# Patient Record
Sex: Female | Born: 2000 | Race: White | Hispanic: No | Marital: Single
Health system: Southern US, Community
[De-identification: ages and names within clinical notes are randomized; demographics above are authoritative.]

## PROBLEM LIST (undated history)

## (undated) ENCOUNTER — Ambulatory Visit (HOSPITAL_COMMUNITY): Admission: EM | Payer: Medicaid Other | Source: Home / Self Care

## (undated) DIAGNOSIS — Z8659 Personal history of other mental and behavioral disorders: Secondary | ICD-10-CM

## (undated) DIAGNOSIS — F419 Anxiety disorder, unspecified: Secondary | ICD-10-CM

## (undated) DIAGNOSIS — F909 Attention-deficit hyperactivity disorder, unspecified type: Secondary | ICD-10-CM

## (undated) DIAGNOSIS — F329 Major depressive disorder, single episode, unspecified: Secondary | ICD-10-CM

## (undated) HISTORY — PX: ADENOIDECTOMY: SUR15

## (undated) HISTORY — PX: TONSILLECTOMY: SUR1361

## (undated) HISTORY — PX: APPENDECTOMY: SHX54

---
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
## 2023-03-16 ENCOUNTER — Ambulatory Visit (HOSPITAL_COMMUNITY)
Admission: EM | Admit: 2023-03-16 | Discharge: 2023-03-16 | Disposition: A | Discharge status: Home / Self Care | Payer: Medicaid Other | Attending: Family | Admitting: Family

## 2023-03-16 DIAGNOSIS — F4323 Adjustment disorder with mixed anxiety and depressed mood: Secondary | ICD-10-CM

## 2023-03-16 NOTE — ED Provider Notes (Signed)
Behavioral Health Urgent Care Medical Screening Exam  Patient Name: Rachel Mullins MRN: [id number] Date of Evaluation: 03/16/23 Chief Complaint:   Diagnosis:  Final diagnoses:  Adjustment disorder with mixed anxiety and depressed mood    History of Present illness: Rachel Mullins is a 22 y.o. female. Patient presents voluntarily to [facility] behavioral health for walk-in assessment.  Patient is assessed, face-to-face, by nurse practitioner. She is seated in assessment area, no acute distress. Consulted with provider, Dr.  Dwyane Dee, and chart reviewed on 03/16/2023. She  is alert and oriented, pleasant and cooperative during assessment.   Rachel Mullins intended to be seen in outpatient walk-in, upon arrival made aware walk-in hours are limited.  She would like to be linked with medication management as well as individual counseling.  She has an appointment with previous outpatient therapist at [facility] pediatrics upcoming on Tuesday April 9th. She states "I would really prefer to see somebody else, I stopped seeing her last year."  Recent stressors include increased anxiety causing her to miss work both today and yesterday.  Chronic stressors include the death of her father 1 year ago.  Patient endorses increased anxiety ongoing x 1 month.  She was prescribed Lexapro by primary care provider several days ago.  She did not begin this medication yet, would like to establish with outpatient provider to review medications prior to initiation.  She reports she has been prescribed multiple medications in the past.  Most recently prescribed sertraline last year reports it caused her "cannot have any thoughts, I felt like a zombie."  She stopped this medication approximately 1 year ago.  She was prescribed Abilify however caused her to feel angry.  She reports she was prescribed 2 mood stabilizers unable to recall name of medications, these medications were not effective in treating her depression.  She endorses  history of major depressive disorder, suicidal ideation and anxiety.  2 previous inpatient psychiatric hospitalizations.  At age 57 she reports she "had a mental breakdown."  At age 81 she ingested an intentional overdose in a suicide attempt.  Both previous admissions at [facility] behavioral health.  No family mental health history reported.  Patient  presents with anxious mood, congruent affect. She  denies suicidal and homicidal ideations. Denies history of non suicidal self-harm.  Patient easily  contracts verbally for safety with this Probation officer.    Patient has normal speech and behavior.  She  denies auditory and visual hallucinations.  Patient is able to converse coherently with goal-directed thoughts and no distractibility or preoccupation.  Denies symptoms of paranoia.  Objectively there is no evidence of psychosis/mania or delusional thinking.  Rachel Mullins resides in [place] with her mother and older brother.  She denies access to weapons.  She is employed in the Production designer, theatre/television/film.  Patient endorses average sleep and appetite.  She endorses daily marijuana use.  She denies alcohol use, denies substance use aside from marijuana.  Patient offered support and encouragement.  Prefers to follow-up with outpatient psychiatry on tomorrow versus admission.  Rachel Mullins gives verbal consent to speak with her mother, Rachel Mullins phone number [phone].  Patient's mother verbalizes agreement with plan for follow-up with outpatient psychiatry.   Patient and family are educated and verbalize understanding of mental health resources and other crisis services in the community. They are instructed to call 911 and present to the nearest emergency room should patient experience any suicidal/homicidal ideation, auditory/visual/hallucinations, or detrimental worsening of mental health condition.      [facility] from  03/16/2023 in [place] No Risk       Psychiatric  Specialty Exam  Presentation  General Appearance:Appropriate for Environment; Casual  Eye Contact:Good  Speech:Clear and Coherent; Normal Rate  Speech Volume:Normal  Handedness:Right   Mood and Affect  Mood: Anxious  Affect: Congruent   Thought Process  Thought Processes: Coherent; Goal Directed; Linear  Descriptions of Associations:Intact  Orientation:Full (Time, Place and Person)  Thought Content:Logical    Hallucinations:None  Ideas of Reference:None  Suicidal Thoughts:No  Homicidal Thoughts:No   Sensorium  Memory: Immediate Good; Recent Good  Judgment: Fair  Insight: Fair   Community education officer  Concentration: Good  Attention Span: Good  Recall: Good  Fund of Knowledge: Good  Language: Good   Psychomotor Activity  Psychomotor Activity: Normal   Assets  Assets: Communication Skills; Desire for Improvement; Financial Resources/Insurance; Housing; Intimacy; Leisure Time; Physical Health; Resilience   Sleep  Sleep: Fair  Number of hours: No data recorded  Physical Exam: Physical Exam Vitals and nursing note reviewed.  Constitutional:      Appearance: Normal appearance. She is well-developed and normal weight.  HENT:     Head: Normocephalic and atraumatic.     Nose: Nose normal.  Cardiovascular:     Rate and Rhythm: Normal rate.  Pulmonary:     Effort: Pulmonary effort is normal.  Musculoskeletal:        General: Normal range of motion.     Cervical back: Normal range of motion.  Skin:    General: Skin is warm and dry.  Neurological:     Mental Status: She is alert and oriented to person, place, and time.  Psychiatric:        Attention and Perception: Attention and perception normal.        Mood and Affect: Affect normal. Mood is anxious.        Speech: Speech normal.        Behavior: Behavior normal. Behavior is cooperative.        Thought Content: Thought content normal.        Cognition and Memory: Cognition  and memory normal.    Review of Systems  Constitutional: Negative.   HENT: Negative.    Eyes: Negative.   Respiratory: Negative.    Cardiovascular: Negative.   Gastrointestinal: Negative.   Genitourinary: Negative.   Musculoskeletal: Negative.   Skin: Negative.   Neurological: Negative.   Psychiatric/Behavioral:  The patient is nervous/anxious.    Blood pressure 138/82, pulse 74, temperature 98.3 F (36.8 C), temperature source Oral, resp. rate 18, SpO2 100 %. There is no height or weight on file to calculate BMI.  Musculoskeletal: Strength & Muscle Tone: within normal limits Gait & Station: normal Patient leans: N/A   [place] [facility] Discharge Disposition for Follow up and Recommendations: Based on my evaluation the patient does not appear to have an emergency medical condition and can be discharged with resources and follow up care in outpatient services for Medication Management and Individual Therapy Follow-up with outpatient psychiatry, resources provided.  Continue current medications.   Lucky Rathke, FNP 03/16/2023, 7:14 PM

## 2023-03-16 NOTE — Progress Notes (Signed)
   03/16/23 1753  [place] (Walk-ins at [facility] only)  How Did You Hear About [place]? Family/Friend  What Is the Reason for Your Visit/Call Today? ROUTINE: Rachel Mullins is a 22 year old female presents for evaluation of increased anxiety. She has a history of ADHD, anxiety, despair, suicidal ideation, and self-injurious behaviors. The patient states that since she was 22 years old, she has had persistent symptoms. No current SI. No prior AVH or HI. No alcohol use. However, patient does smoke THC daily and last use was 2 weeks ago. The patient reports that her anxiety and panic attacks have increased within the last week. She identifies her father's passing from last year as a stressor or trigger. She has lost her appetite and been sleeping poorly due to her anxiousness. The patient's anxiousness has caused her to miss work for the past two days. Two to three weeks ago, she saw her PCP for bronchitis. Her PCP informed her during that appointment that her problems were caused by panic episodes rather than bronchitis. A prescription for Lexapro was provided. However, the patient's concern of negative side effects led her deciding against taking the medication. Last week, the PCP suggested that she visit their psychiatrist, and an appointment was set. The patient, however, accidently missed her appointment. She has come in today asking to be prescribed a different drug that will have less side effects in order to control her anxiety. Patient lives with her mother.  How Long Has This Been Causing You Problems? > than 6 months  Have You Recently Had Any Thoughts About Hurting Yourself? No  Are You Planning to Commit Suicide/Harm Yourself At This time? No  Have you Recently Had Thoughts About [place]? No  Are You Planning To Harm Someone At This Time? No  Are you currently experiencing any auditory, visual or other hallucinations? No  Have You Used Any Alcohol or Drugs in the Past 24 Hours? No   Do you have any current medical co-morbidities that require immediate attention? No  Clinician description of patient physical appearance/behavior: Patient appears anxious. Polite.  What Do You Feel Would Help You the Most Today? Treatment for Depression or other mood problem;Medication(s);Stress Management  If access to [facility] was not available, would you have sought care in the Emergency Department? No  Determination of Need Routine (7 days)  Options For Referral Medication Management;Outpatient Therapy;Intensive Outpatient Therapy;Partial Hospitalization

## 2023-03-16 NOTE — Discharge Instructions (Signed)
Patient is instructed prior to discharge to:  Take all medications as prescribed by his/her mental healthcare provider. Report any adverse effects and or reactions from the medicines to his/her outpatient provider promptly. Keep all scheduled appointments, to ensure that you are getting refills on time and to avoid any interruption in your medication.  If you are unable to keep an appointment call to reschedule.  Be sure to follow-up with resources and follow-up appointments provided.  Patient has been instructed & cautioned: To not engage in alcohol and or illegal drug use while on prescription medicines. In the event of worsening symptoms, patient is instructed to call the crisis hotline, 911 and or go to the nearest ED for appropriate evaluation and treatment of symptoms. To follow-up with his/her primary care provider for your other medical issues, concerns and or health care needs.  Information: -National Suicide Prevention Lifeline 1-800-SUICIDE or 1-800-273-8255.  -988 offers 24/7 access to trained crisis counselors who can help people experiencing mental health-related distress. People can call or text 988 or chat 988lifeline.org for themselves or if they are worried about a loved one who may need crisis support.     Outpatient Services for Therapy and Medication Management for Medicaid  [facility] [address] [place], [place], [address] [phone] phone  New Patient Assessment/Therapy Walk-ins Monday and Wednesday: 8am until slots are full. Every 1st and 2nd Friday: 1pm - 5pm  NO ASSESSMENT/THERAPY WALK-INS ON TUESDAYS OR THURSDAYS  New Patient Psychiatry/Medication Management Walk-ins Monday-Friday: 8am-11am  For all walk-ins, we ask that you arrive by 7:30am because patient will be seen in the order of arrival.  Availability is limited; therefore, you may not be seen on the same day that you walk-in.  Our goal is to serve and meet the needs of our  community to the best of our ability.   [facility] [address] [place], [place], [address] [phone] phone  [facility] [address] [place], [place], [address] [phone] phone (Aetna, AmeriHealth [place], BCBS, Cigna, Ever[employer], [employer] Health Plans, [facility], BCBS Healthy Blue, Humana, [employer], Medcost, Medicare, Medicaid, Optum, Tricare, UHC, UHC Community Plan, Wellcare)  [facility] [address] [place], [place], [address] [phone] phone  [facility] [address] [place], [place], [address] [phone] phone  [facility] [address] [place], [place], [address] [phone] phone  [facility] [address] [place], [place], [address] [phone] phone  [facility] [address] [place], [place], [address] [phone] phone  [facility] [address] [place], [place], [address] [phone] phone [phone] fax  [facility] [address] [place], [place], [address] [phone] phone [phone] fax  [facility] [address] [place], [place], [address] [phone] phone  [facility] [address] [place], [place], [address]  [phone] phone  [facility]  (Adults Only) [address] [place], , [address]  [phone] phone [phone] fax  

## 2023-03-17 ENCOUNTER — Ambulatory Visit (INDEPENDENT_AMBULATORY_CARE_PROVIDER_SITE_OTHER): Payer: Medicaid Other | Admitting: Physician Assistant

## 2023-03-17 VITALS — BP 128/89 | HR 70 | Ht 63.0 in | Wt 109.6 lb

## 2023-03-17 DIAGNOSIS — F410 Panic disorder [episodic paroxysmal anxiety] without agoraphobia: Secondary | ICD-10-CM

## 2023-03-17 DIAGNOSIS — F411 Generalized anxiety disorder: Secondary | ICD-10-CM

## 2023-03-17 MED ORDER — BUSPIRONE HCL 7.5 MG PO TABS: 7.5000 mg | ORAL_TABLET | Freq: Two times a day (BID) | ORAL | 1 refills | Status: DC

## 2023-03-17 MED ORDER — HYDROXYZINE HCL 10 MG PO TABS: 10.0000 mg | ORAL_TABLET | Freq: Three times a day (TID) | ORAL | 1 refills | Status: DC | PRN
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
## 2023-03-18 ENCOUNTER — Encounter (HOSPITAL_COMMUNITY): Payer: Self-pay | Admitting: Physician Assistant

## 2023-04-20 ENCOUNTER — Telehealth (HOSPITAL_COMMUNITY): Payer: Self-pay | Admitting: *Deleted

## 2023-04-20 NOTE — Telephone Encounter (Signed)
Patient called asking for refill of Buspar. Has appointment scheduled end of May.

## 2023-04-21 ENCOUNTER — Other Ambulatory Visit (HOSPITAL_COMMUNITY): Payer: Self-pay | Admitting: Physician Assistant

## 2023-04-21 DIAGNOSIS — F410 Panic disorder [episodic paroxysmal anxiety] without agoraphobia: Secondary | ICD-10-CM

## 2023-04-21 MED ORDER — BUSPIRONE HCL 7.5 MG PO TABS: 7.5000 mg | ORAL_TABLET | Freq: Two times a day (BID) | ORAL | 1 refills | Status: DC

## 2023-04-21 NOTE — Telephone Encounter (Signed)
Patient's medication to be e-prescribed to pharmacy of choice. 

## 2023-04-21 NOTE — Progress Notes (Signed)
Provider was contacted by Elder Love, RN regarding request for patient's medication to be refilled. Patient's medication to be e-prescribed to pharmacy of choice.

## 2023-04-27 ENCOUNTER — Ambulatory Visit (INDEPENDENT_AMBULATORY_CARE_PROVIDER_SITE_OTHER): Payer: Medicaid Other | Admitting: Physician Assistant

## 2023-04-27 DIAGNOSIS — F411 Generalized anxiety disorder: Secondary | ICD-10-CM | POA: Diagnosis not present

## 2023-04-27 DIAGNOSIS — F410 Panic disorder [episodic paroxysmal anxiety] without agoraphobia: Secondary | ICD-10-CM | POA: Diagnosis not present

## 2023-04-27 MED ORDER — BUSPIRONE HCL 10 MG PO TABS: 10.0000 mg | ORAL_TABLET | Freq: Two times a day (BID) | ORAL | 1 refills | Status: DC

## 2023-04-27 MED ORDER — HYDROXYZINE HCL 25 MG PO TABS: 25.0000 mg | ORAL_TABLET | Freq: Three times a day (TID) | ORAL | 1 refills | Status: DC | PRN

## 2023-04-27 NOTE — Progress Notes (Signed)
BH MD/PA/NP OP Progress Note  04/28/2023 1:33 AM Rachel Mullins  MRN:  [id number]  Chief Complaint:  Chief Complaint  Patient presents with   Follow-up   Medication Management   HPI:   Rachel Mullins is a 22 year old, Caucasian female with a past psychiatric history significant for depression, generalized anxiety disorder with panic attacks, attention deficit hyperactivity disorder (unspecified type), and adjustment disorder who presents to [facility] for follow-up and medication management.  Patient is currently being managed on the following psychiatric medications:  Hydroxyzine 10 mg 3 times daily as needed Buspirone 7.5 mg 2 times daily  Patient reports that both medications have been helpful in the management of her anxiety.  She reports that the use of her hydroxyzine has helped to prevent panic attacks.  Patient does report that her buspirone could be stronger.  Patient continues to endorse ongoing anxiety and rates her anxiety an 8 out of 10.  Patient's current stressors include the need to get her own place and her brother being at home.  Patient also reports that her first cousin recently died.  Patient endorses depression and rates her depression as 6 out of 10 with 10 being most severe.  Patient endorses depression every day with symptoms lasting the whole day depending on what she is thinking about.  Patient endorses the following depressive symptoms: Dissociation, numbness, and decreased appetite.  A PHQ-9 screen was performed with the patient scoring a 20.  A GAD-7 screen was also performed with the patient scored a 20.  Patient is alert and oriented x 4, calm, cooperative, and fully engaged in conversation during the encounter.  Patient describes her mood as tired and not in the best mood due to her mother.  Patient denies suicidal or homicidal ideations.  She further denies auditory or visual hallucinations and does not appear to be responding to  internal/external stimuli.  Patient endorses fair sleep and receives on average 5 to 7 hours of sleep each night.  Patient endorses fair appetite and eats on average 1-2 meals per day.  Patient denies alcohol consumption.  Patient denies tobacco use but does engage in vaping.  Patient endorses illicit drug use in the form of weed but states that she has cut back some.  Visit Diagnosis:    ICD-10-CM   1. Generalized anxiety disorder with panic attacks  F41.1 hydrOXYzine (ATARAX) 25 MG tablet   F41.0 busPIRone (BUSPAR) 10 MG tablet      Past Psychiatric History:  Patient has been diagnosed with the following psychiatric diagnoses: Depression, anxiety, ADHD, adjustment disorder, and possible bipolar disorder   Patient reports that she has been hospitalized for mental health twice.  She states that she was first hospitalized when she was 22 years old due to attempted suicide.  Patient was also hospitalized when she was 22 years of age but states that she does not remember why.  Although patient remembers during her second hospitalization was that she was in the back of a police car and she was taking a lot of medications at the time.   Patient endorses a past history of suicide attempt when she was 22 years of age.  Patient states that during her suicide attempt, she ingested 3 bottles of pills that were prescribed to her.   Patient denies a past history of homicide attempt  Past Medical History:  Past Medical History:  Diagnosis Date   ADHD (attention deficit hyperactivity disorder)    Anxiety  Depression 07/2015   History of eating disorder 10/09/2015    Past Surgical History:  Procedure Laterality Date   ADENOIDECTOMY     APPENDECTOMY     TONSILLECTOMY      Family Psychiatric History:  Father: depression Mother: anxiety Brother: possible bipolar disorder but has not been officially diagnosed Aunt (paternal) - depression   Family history of suicide attempts: Patient  denies Family history of homicide attempts: Patient denies Family history of substance abuse: Patient reports that her brother abuses cocaine and meth.  She reports that her twin brother abuses cocaine  Family History:  Family History  Problem Relation Age of Onset   Diabetes Father    Mental illness Brother     Social History:  Social History   Socioeconomic History   Marital status: Single    Spouse name: Not on file   Number of children: Not on file   Years of education: Not on file   Highest education level: Not on file  Occupational History   Not on file  Tobacco Use   Smoking status: Passive Smoke Exposure - Never Smoker   Smokeless tobacco: Never  Substance and Sexual Activity   Alcohol use: No   Drug use: Not on file   Sexual activity: Never  Other Topics Concern   Not on file  Social History Narrative   Not on file   Social Determinants of Health   Financial Resource Strain: Not on file  Food Insecurity: Not on file  Transportation Needs: Not on file  Physical Activity: Not on file  Stress: Not on file  Social Connections: Not on file    Allergies:  Allergies  Allergen Reactions   Sulfamethoxazole Palpitations    Metabolic Disorder Labs: No results found for: "HGBA1C", "MPG" No results found for: "PROLACTIN" No results found for: "CHOL", "TRIG", "HDL", "CHOLHDL", "VLDL", "LDLCALC" Lab Results  Component Value Date   TSH 0.496 01/21/2018    Therapeutic Level Labs: No results found for: "LITHIUM" No results found for: "VALPROATE" No results found for: "CBMZ"  Current Medications: Current Outpatient Medications  Medication Sig Dispense Refill   albuterol (PROAIR HFA) 108 (90 Base) MCG/ACT inhaler Inhale 2 puffs into the lungs every 6 (six) hours as needed for wheezing or shortness of breath.     ARIPiprazole (ABILIFY) 10 MG tablet Take 1 tablet (10 mg total) by mouth at bedtime. 30 tablet 0   buPROPion (WELLBUTRIN XL) 150 MG 24 hr tablet Take  1 tablet (150 mg total) by mouth daily. 30 tablet 0   busPIRone (BUSPAR) 10 MG tablet Take 1 tablet (10 mg total) by mouth 2 (two) times daily. 60 tablet 1   hydrOXYzine (ATARAX) 25 MG tablet Take 1 tablet (25 mg total) by mouth 3 (three) times daily as needed. 75 tablet 1   ibuprofen (ADVIL,MOTRIN) 200 MG tablet Take 400 mg by mouth every 6 (six) hours as needed (for pain or headaches).     No current facility-administered medications for this visit.     Musculoskeletal: Strength & Muscle Tone: within normal limits Gait & Station: normal Patient leans: N/A  Psychiatric Specialty Exam: Review of Systems  Psychiatric/Behavioral:  Negative for decreased concentration, dysphoric mood, hallucinations, self-injury and suicidal ideas. The patient is nervous/anxious. The patient is not hyperactive.     There were no vitals taken for this visit.There is no height or weight on file to calculate BMI.  General Appearance: Casual  Eye Contact:  Fair  Speech:  Clear and  Coherent and Normal Rate  Volume:  Normal  Mood:  Anxious and Depressed  Affect:  Appropriate  Thought Process:  Coherent, Goal Directed, and Descriptions of Associations: Intact  Orientation:  Full (Time, Place, and Person)  Thought Content: WDL   Suicidal Thoughts:  No  Homicidal Thoughts:  No  Memory:  Immediate;   Good Recent;   Good Remote;   Good  Judgement:  Good  Insight:  Fair  Psychomotor Activity:  Normal  Concentration:  Concentration: Good and Attention Span: Good  Recall:  Good  Fund of Knowledge: Good  Language: Good  Akathisia:  No  Handed:  Right  AIMS (if indicated): not done  Assets:  Communication Skills Desire for Improvement Housing Social Support Vocational/Educational  ADL's:  Intact  Cognition: WNL  Sleep:  Fair   Screenings: AIMS    Flowsheet Row [facility] Ad[facility] (Discharged[facility]  AIMS Total Score 0 0      GAD-7    Flowsheet Row Clinical Support from 04/27/2023 in [facility] Office Visit from 03/17/2023 in [facility]  Total GAD-7 Score 20 19      PHQ2-9    Flowsheet Row Clinical Support from 04/27/2023 in [facility] Office Visit from 03/17/2023 in [facility]  PHQ-2 Total Score 4 4  PHQ-9 Total Score 20 19      Flowsheet Row Clinical Support from 04/27/2023 in [facility] Office Visit from 03/17/2023 in [facility] ED from 03/16/2023 in [facility]  C-SSRS RISK CATEGORY Low Risk Low Risk No Risk        Assessment and Plan:   Kyiah Saca is a 22 year old, Caucasian female with a past psychiatric history significant for depression, generalized anxiety disorder with panic attacks, attention deficit hyperactivity disorder (unspecified type), and adjustment disorder who presents to [facility] for follow-up and medication management.  Patient reports that her medications have been helpful in managing her anxiety and limiting her panic attacks; however, patient believes that her buspirone could be more effective.  Patient continues to endorse ongoing anxiety as well as some depressive symptoms.  Provider recommended increasing patient's hydroxyzine from 10 mg to 25 mg 3 times daily as needed for the management of her anxiety.  Lastly, provider recommended increasing patient's buspirone from 7.5 mg to 10 mg 2 times daily for the management of her anxiety/panic attacks.  Patient was agreeable to recommendation.  Patient's medications to be prescribed to pharmacy of choice.  Provider allowed for time at the end of the encounter to discuss potential adverse side effects to patient's current  medication regimen.  Patient vocalized understanding.  Provider encouraged patient to abstain from marijuana use.  Collaboration of Care: Collaboration of Care: Medication Management AEB provider managing patient's psychiatric medications, Psychiatrist AEB patient being followed by mental health provider at this facility, and Referral or follow-up with counselor/therapist AEB patient being seen by a licensed clinical social worker at this facility  Patient/Guardian was advised Release of Information must be obtained prior to any record release in order to collaborate their care with an outside provider. Patient/Guardian was advised if they have not already done so to contact the registration department to sign all necessary forms in order for [place] to release information regarding their care.  Consent: Patient/Guardian gives verbal consent for treatment and assignment of benefits for services provided during this visit. Patient/Guardian expressed understanding and agreed to proceed.   1. Generalized anxiety disorder with panic attacks  - hydrOXYzine (ATARAX) 25 MG tablet; Take 1 tablet (25 mg total) by mouth 3 (three) times daily as needed.  Dispense: 75 tablet; Refill: 1 - busPIRone (BUSPAR) 10 MG tablet; Take 1 tablet (10 mg total) by mouth 2 (two) times daily.  Dispense: 60 tablet; Refill: 1  Patient to follow-up in 6 weeks Provider spent a total of 16 minutes with the patient/reviewing patient's chart  Meta Hatchet, PA 04/28/2023, 1:33 AM

## 2023-04-28 ENCOUNTER — Encounter (HOSPITAL_COMMUNITY): Payer: Self-pay | Admitting: Physician Assistant

## 2023-05-01 ENCOUNTER — Ambulatory Visit (INDEPENDENT_AMBULATORY_CARE_PROVIDER_SITE_OTHER): Payer: Medicaid Other | Admitting: Mental Health

## 2023-05-01 DIAGNOSIS — F411 Generalized anxiety disorder: Secondary | ICD-10-CM

## 2023-05-01 DIAGNOSIS — F410 Panic disorder [episodic paroxysmal anxiety] without agoraphobia: Secondary | ICD-10-CM

## 2023-05-01 DIAGNOSIS — F331 Major depressive disorder, recurrent, moderate: Secondary | ICD-10-CM

## 2023-05-02 ENCOUNTER — Ambulatory Visit (HOSPITAL_COMMUNITY): Payer: PRIVATE HEALTH INSURANCE | Admitting: Mental Health

## 2023-05-02 DIAGNOSIS — F410 Panic disorder [episodic paroxysmal anxiety] without agoraphobia: Secondary | ICD-10-CM | POA: Insufficient documentation
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
## 2023-05-23 ENCOUNTER — Telehealth (HOSPITAL_COMMUNITY): Payer: Self-pay | Admitting: *Deleted

## 2023-05-23 NOTE — Telephone Encounter (Signed)
Patient called stating that she would like to speak with her provider. Was noticing on the higher dose of Buspar that her heart would race and she felt more panic and anxiety. Would like to go back to the lower dosage.

## 2023-05-30 ENCOUNTER — Other Ambulatory Visit (HOSPITAL_COMMUNITY): Payer: Self-pay | Admitting: Physician Assistant

## 2023-05-30 DIAGNOSIS — F410 Panic disorder [episodic paroxysmal anxiety] without agoraphobia: Secondary | ICD-10-CM

## 2023-05-30 MED ORDER — BUSPIRONE HCL 7.5 MG PO TABS: 7.5000 mg | ORAL_TABLET | Freq: Two times a day (BID) | ORAL | 1 refills | Status: AC

## 2023-05-30 NOTE — Progress Notes (Signed)
Provider was contacted by Elder Love, RN regarding patient's concern over her current dosage of buspirone.  Per message, patient reports that she experiences elevated anxiety, panic, and elevated heart rate on her current dosage of buspirone.  Patient requests for buspirone dosage to be lowered.  Provider to prescribe buspirone at previous dosage (7.5 mg 2 times daily).  Patient's medication to be e-prescribed to pharmacy of choice.

## 2023-05-30 NOTE — Telephone Encounter (Signed)
Message acknowledged and reviewed. Patient to be prescribed Buspirone at previous dosage.

## 2023-06-01 ENCOUNTER — Telehealth (HOSPITAL_COMMUNITY): Payer: Self-pay | Admitting: *Deleted

## 2023-06-01 NOTE — Telephone Encounter (Signed)
Call from patient stating she has been waiting on her Buspirone for a week. Reviewed chart, the dose was lowered at her request and sent into her preferred pharmacy on 05/30/23. She was informed of this. States she has a new phone number recently and was probably the reason she did not know about the rx being available because we did not have her new number. New number is [phone]

## 2023-06-07 ENCOUNTER — Ambulatory Visit (HOSPITAL_COMMUNITY): Payer: Medicaid Other | Admitting: Mental Health

## 2023-06-09 ENCOUNTER — Ambulatory Visit (INDEPENDENT_AMBULATORY_CARE_PROVIDER_SITE_OTHER): Payer: Medicaid Other | Admitting: Physician Assistant

## 2023-06-09 ENCOUNTER — Encounter (HOSPITAL_COMMUNITY): Payer: Self-pay | Admitting: Physician Assistant

## 2023-06-09 VITALS — BP 126/84 | HR 57 | Temp 98.4°F | Ht 64.0 in | Wt 114.0 lb

## 2023-06-09 DIAGNOSIS — F410 Panic disorder [episodic paroxysmal anxiety] without agoraphobia: Secondary | ICD-10-CM | POA: Diagnosis not present

## 2023-06-09 DIAGNOSIS — F411 Generalized anxiety disorder: Secondary | ICD-10-CM | POA: Diagnosis not present

## 2023-06-09 DIAGNOSIS — F331 Major depressive disorder, recurrent, moderate: Secondary | ICD-10-CM | POA: Diagnosis not present

## 2023-06-09 MED ORDER — TRAZODONE HCL 50 MG PO TABS: 50.0000 mg | ORAL_TABLET | Freq: Every day | ORAL | 2 refills | Status: AC

## 2023-06-09 MED ORDER — CITALOPRAM HYDROBROMIDE 10 MG PO TABS: 10.0000 mg | ORAL_TABLET | Freq: Every day | ORAL | 2 refills | Status: AC

## 2023-06-09 MED ORDER — HYDROXYZINE HCL 10 MG PO TABS: 25.0000 mg | ORAL_TABLET | Freq: Three times a day (TID) | ORAL | 1 refills | Status: AC | PRN

## 2023-06-09 NOTE — Progress Notes (Unsigned)
BH MD/PA/NP OP Progress Note  06/09/2023 2:53 PM Rachel Mullins  MRN:  [id number]  Chief Complaint:  Chief Complaint  Patient presents with   Follow-up   Medication Management   HPI:   Rachel Mullins is a 22 year old, Caucasian female with a past psychiatric history significant for depression, generalized anxiety disorder with panic attacks, attention deficit hyperactivity disorder (unspecified type), and adjustment disorder who presents to [facility] for follow-up and medication management.  Patient is currently being managed on the following psychiatric medications:  Hydroxyzine 10 mg 3 times daily as needed Buspirone 7.5 mg 2 times daily  Patient presents today encounter stating that her anxiety has become more severe.  She reports that she cannot go out to eat or go to the store due to her anxiety.  In addition to her anxiety, patient endorses panic attacks.  She reports that her panic attacks are so severe that she passes out.  She reports that her panic attacks are not happening more frequently, but when they do happen, they are more severe.  Patient reports that her sleep has been affected by her anxiety and states that she often wakes up with anxiety.  Patient rates her anxiety at 10 out of 10.  She is unable to attribute a specific cause to her anxiety at this time.  In addition to her anxiety, patient endorses depression.  She reports that her depression is often accompanied by her anxiety.  Patient also endorses being angry all the time.  Patient rates her depression as 7 out of 10 with 10 being most severe.  Patient endorses depressive episodes every day with symptoms coming and going in waves.  Patient endorses the following depressive symptoms: low mood, irritability, decreased appetite, lack of motivation.  In addition to her depressive symptoms, patient also endorses mood swings, irritability, elevated mood/feeling energized, and lack of sleep.   Patient reports that she has had a history of going days without sleep and states that the most sleep she has gone without has been 2 days.  During the time she went 2 days without sleep, she denies using any illicit substances.  A PHQ-9 screen was performed with the patient scoring an 18.  A GAD-7 screen was also performed with the patient scoring a 20.  Patient is alert and oriented x 4, calm, cooperative, and fully engaged in conversation during the encounter.  Patient describes her mood as very anxious.  Patient denies suicidal or homicidal ideations.  She further denies auditory or visual hallucinations and does not appear to be responding to internal/external stimuli.  Patient endorses fair sleep and receives on average 5 to 6 hours of sleep per night.  Patient endorses fair appetite and eats on average 2 meals per day.  Patient endorses alcohol consumption sparingly.  Patient denies tobacco use.  Patient endorses illicit drug use in the form of marijuana. Visit Diagnosis:    ICD-10-CM   1. MDD (major depressive disorder), recurrent episode, moderate (HCC)  F33.1 citalopram (CELEXA) 10 MG tablet    traZODone (DESYREL) 50 MG tablet    2. Generalized anxiety disorder with panic attacks  F41.1 citalopram (CELEXA) 10 MG tablet   F41.0 hydrOXYzine (ATARAX) 10 MG tablet      Past Psychiatric History:  Patient has been diagnosed with the following psychiatric diagnoses: Depression, anxiety, ADHD, adjustment disorder, and possible bipolar disorder   Patient reports that she has been hospitalized for mental health twice.  She states that she was  first hospitalized when she was 22 years old due to attempted suicide.  Patient was also hospitalized when she was 22 years of age but states that she does not remember why.  Although patient remembers during her second hospitalization was that she was in the back of a police car and she was taking a lot of medications at the time.   Patient endorses a past  history of suicide attempt when she was 22 years of age.  Patient states that during her suicide attempt, she ingested 3 bottles of pills that were prescribed to her.   Patient denies a past history of homicide attempt  Past Medical History:  Past Medical History:  Diagnosis Date   ADHD (attention deficit hyperactivity disorder)    Anxiety    Depression 07/2015   History of eating disorder 10/09/2015    Past Surgical History:  Procedure Laterality Date   ADENOIDECTOMY     APPENDECTOMY     TONSILLECTOMY      Family Psychiatric History:  Father: depression Mother: anxiety Brother: possible bipolar disorder but has not been officially diagnosed Aunt (paternal) - depression   Family history of suicide attempts: Patient denies Family history of homicide attempts: Patient denies Family history of substance abuse: Patient reports that her brother abuses cocaine and meth.  She reports that her twin brother abuses cocaine  Family History:  Family History  Problem Relation Age of Onset   Diabetes Father    Mental illness Brother     Social History:  Social History   Socioeconomic History   Marital status: Single    Spouse name: Not on file   Number of children: Not on file   Years of education: Not on file   Highest education level: Not on file  Occupational History   Not on file  Tobacco Use   Smoking status: Passive Smoke Exposure - Never Smoker   Smokeless tobacco: Never  Substance and Sexual Activity   Alcohol use: No   Drug use: Not on file   Sexual activity: Never  Other Topics Concern   Not on file  Social History Narrative   Not on file   Social Determinants of Health   Financial Resource Strain: High Risk (05/01/2023)   Overall Financial Resource Strain (CARDIA)    Difficulty of Paying Living Expenses: Hard  Food Insecurity: Food Insecurity Present (05/01/2023)   Hunger Vital Sign    Worried About Running Out of Food in the Last Year: Sometimes true     Ran Out of Food in the Last Year: Sometimes true  Transportation Needs: No Transportation Needs (05/01/2023)   PRAPARE - Administrator, Civil Service (Medical): No    Lack of Transportation (Non-Medical): No  Physical Activity: Inactive (05/01/2023)   Exercise Vital Sign    Days of Exercise per Week: 0 days    Minutes of Exercise per Session: 0 min  Stress: Stress Concern Present (05/01/2023)   [employer] of Occupational Health - Occupational Stress Questionnaire    Feeling of Stress : Very much  Social Connections: Socially Isolated (05/01/2023)   Social Connection and Isolation Panel [NHANES]    Frequency of Communication with Friends and Family: More than three times a week    Frequency of Social Gatherings with Friends and Family: Never    Attends Religious Services: Never    Database administrator or Organizations: No    Attends Banker Meetings: Never    Marital Status: Never married  Allergies:  Allergies  Allergen Reactions   Sulfamethoxazole Palpitations    Metabolic Disorder Labs: No results found for: "HGBA1C", "MPG" No results found for: "PROLACTIN" No results found for: "CHOL", "TRIG", "HDL", "CHOLHDL", "VLDL", "LDLCALC" Lab Results  Component Value Date   TSH 0.496 01/21/2018    Therapeutic Level Labs: No results found for: "LITHIUM" No results found for: "VALPROATE" No results found for: "CBMZ"  Current Medications: Current Outpatient Medications  Medication Sig Dispense Refill   citalopram (CELEXA) 10 MG tablet Take 1 tablet (10 mg total) by mouth daily. 30 tablet 2   traZODone (DESYREL) 50 MG tablet Take 1 tablet (50 mg total) by mouth at bedtime. 30 tablet 2   albuterol (PROAIR HFA) 108 (90 Base) MCG/ACT inhaler Inhale 2 puffs into the lungs every 6 (six) hours as needed for wheezing or shortness of breath.     ARIPiprazole (ABILIFY) 10 MG tablet Take 1 tablet (10 mg total) by mouth at bedtime. 30 tablet 0   buPROPion  (WELLBUTRIN XL) 150 MG 24 hr tablet Take 1 tablet (150 mg total) by mouth daily. 30 tablet 0   busPIRone (BUSPAR) 7.5 MG tablet Take 1 tablet (7.5 mg total) by mouth 2 (two) times daily. 60 tablet 1   hydrOXYzine (ATARAX) 10 MG tablet Take 2.5 tablets (25 mg total) by mouth 3 (three) times daily as needed. 75 tablet 1   ibuprofen (ADVIL,MOTRIN) 200 MG tablet Take 400 mg by mouth every 6 (six) hours as needed (for pain or headaches).     No current facility-administered medications for this visit.     Musculoskeletal: Strength & Muscle Tone: within normal limits Gait & Station: normal Patient leans: N/A  Psychiatric Specialty Exam: Review of Systems  Psychiatric/Behavioral:  Positive for sleep disturbance. Negative for decreased concentration, dysphoric mood, hallucinations, self-injury and suicidal ideas. The patient is nervous/anxious. The patient is not hyperactive.     Blood pressure 126/84, pulse (!) 57, temperature 98.4 F (36.9 C), temperature source Oral, height 5\' 4"  (1.626 m), weight 114 lb (51.7 kg), SpO2 100 %.Body mass index is 19.57 kg/m.  General Appearance: Casual  Eye Contact:  Fair  Speech:  Clear and Coherent and Normal Rate  Volume:  Normal  Mood:  Anxious and Depressed  Affect:  Congruent  Thought Process:  Coherent, Goal Directed, and Descriptions of Associations: Intact  Orientation:  Full (Time, Place, and Person)  Thought Content: WDL   Suicidal Thoughts:  No  Homicidal Thoughts:  No  Memory:  Immediate;   Good Recent;   Good Remote;   Good  Judgement:  Good  Insight:  Fair  Psychomotor Activity:  Normal  Concentration:  Concentration: Good and Attention Span: Good  Recall:  Good  Fund of Knowledge: Good  Language: Good  Akathisia:  No  Handed:  Right  AIMS (if indicated): not done  Assets:  Communication Skills Desire for Improvement Housing Social Support Vocational/Educational  ADL's:  Intact  Cognition: WNL  Sleep:  Fair    Screenings: AIMS    Flowsheet Row [facility] Ad[facility] ([facility]  AIMS Total Score 0 0      GAD-7    Flowsheet Row Clinical Support from 06/09/2023 in [facility] Clinical Support from 04/27/2023 in [facility] Office Visit from 03/17/2023 in [facility]  Total GAD-7 Score 20 20 19  PHQ2-9    Flowsheet Row Clinical Support from 06/09/2023 in [facility] Clinical Support from 04/27/2023 in [facility] Office Visit from 03/17/2023 in [facility]  PHQ-2 Total Score 4 4 4   PHQ-9 Total Score 18 20 19       Flowsheet Row Clinical Support from 06/09/2023 in [facility] Counselor from 05/01/2023 in [facility] Clinical Support from 04/27/2023 in [facility]  C-SSRS RISK CATEGORY Low Risk Error: Q7 should not be populated when Q6 is No Low Risk        Assessment and Plan:   Eufelia Stepper is a 23 year old, Caucasian female with a past psychiatric history significant for depression, generalized anxiety disorder with panic attacks, attention deficit hyperactivity disorder (unspecified type), and adjustment disorder who presents to [facility] for follow-up and medication management.  Patient presents to the encounter endorsing worsening anxiety.  Patient states that her panic attacks have also become more severe.  She reports that she is unable to eat or go out to the store due to her anxiety.  Patient also endorses depression due to her anxiety.  In addition to her depressive symptoms, patient endorses mood swings, irritability, and elevated mood at times.   Patient informed provider that she has a past history of being diagnosed with bipolar disorder.  Patient is interested in utilizing an antidepressant to manage her mood.  Patient agreed to using Celexa.  Provider to place patient on Celexa 10 mg daily for the management of her depressive symptoms and anxiety.  Patient also informed provider that she has been struggling with sleep due to her anxiety.  Provider to place patient on trazodone 50 mg at bedtime for the management of her sleep.  Patient to discontinue taking buspirone.  Patient medications to be prescribed to pharmacy of choice.  Collaboration of Care: Collaboration of Care: Medication Management AEB provider managing patient's psychiatric medications, Psychiatrist AEB patient being followed by mental health provider at this facility, and Referral or follow-up with counselor/therapist AEB patient being seen by a licensed clinical social worker at this facility  Patient/Guardian was advised Release of Information must be obtained prior to any record release in order to collaborate their care with an outside provider. Patient/Guardian was advised if they have not already done so to contact the registration department to sign all necessary forms in order for [place] to release information regarding their care.   Consent: Patient/Guardian gives verbal consent for treatment and assignment of benefits for services provided during this visit. Patient/Guardian expressed understanding and agreed to proceed.   1. MDD (major depressive disorder), recurrent episode, moderate (HCC)  - citalopram (CELEXA) 10 MG tablet; Take 1 tablet (10 mg total) by mouth daily.  Dispense: 30 tablet; Refill: 2 - traZODone (DESYREL) 50 MG tablet; Take 1 tablet (50 mg total) by mouth at bedtime.  Dispense: 30 tablet; Refill: 2  2. Generalized anxiety disorder with panic attacks  - citalopram (CELEXA) 10 MG tablet; Take 1 tablet (10 mg total) by mouth daily.  Dispense: 30  tablet; Refill: 2 - hydrOXYzine (ATARAX) 10 MG tablet; Take 2.5 tablets (25 mg total) by mouth 3 (three) times daily as needed.  Dispense: 75 tablet; Refill: 1  Patient to follow-up in 7 weeks Provider spent a total of 19 minutes with the patient/reviewing patient's chart  Meta Hatchet, PA 06/09/2023, 2:53 PM

## 2023-06-13 ENCOUNTER — Telehealth (HOSPITAL_COMMUNITY): Payer: Self-pay | Admitting: *Deleted

## 2023-06-13 NOTE — Telephone Encounter (Signed)
Patient called wants her MD aware that her Hydroxyzine is not working. Having panic attacks for the past 3 days. States it's not helping at all. Asking if something else can be given.

## 2023-07-27 ENCOUNTER — Encounter (HOSPITAL_COMMUNITY): Payer: Medicaid Other | Admitting: Physician Assistant
